# Patient Record
Sex: Female | Born: 1955
Health system: Southern US, Community
[De-identification: ages and names within clinical notes are randomized; demographics above are authoritative.]

## PROBLEM LIST (undated history)

## (undated) HISTORY — PX: TONSILLECTOMY: SUR1361

---

## 2014-12-14 ENCOUNTER — Emergency Department (HOSPITAL_COMMUNITY): Payer: 59

## 2014-12-14 ENCOUNTER — Encounter (HOSPITAL_COMMUNITY): Payer: Self-pay | Admitting: Emergency Medicine

## 2014-12-14 DIAGNOSIS — Z880 Allergy status to penicillin: Secondary | ICD-10-CM | POA: Insufficient documentation

## 2014-12-14 DIAGNOSIS — R42 Dizziness and giddiness: Secondary | ICD-10-CM | POA: Insufficient documentation

## 2014-12-14 DIAGNOSIS — R1013 Epigastric pain: Secondary | ICD-10-CM | POA: Diagnosis not present

## 2014-12-14 DIAGNOSIS — M549 Dorsalgia, unspecified: Secondary | ICD-10-CM | POA: Insufficient documentation

## 2014-12-14 DIAGNOSIS — R110 Nausea: Secondary | ICD-10-CM | POA: Insufficient documentation

## 2014-12-14 DIAGNOSIS — R0602 Shortness of breath: Secondary | ICD-10-CM | POA: Insufficient documentation

## 2014-12-14 DIAGNOSIS — R079 Chest pain, unspecified: Secondary | ICD-10-CM | POA: Insufficient documentation

## 2014-12-14 LAB — BASIC METABOLIC PANEL
ANION GAP: 6 (ref 5–15)
BUN: 11 mg/dL (ref 6–20)
CALCIUM: 9.7 mg/dL (ref 8.9–10.3)
CO2: 30 mmol/L (ref 22–32)
CREATININE: 0.78 mg/dL (ref 0.44–1.00)
Chloride: 105 mmol/L (ref 101–111)
Glucose, Bld: 123 mg/dL — ABNORMAL HIGH (ref 65–99)
Potassium: 3.8 mmol/L (ref 3.5–5.1)
Sodium: 141 mmol/L (ref 135–145)

## 2014-12-14 LAB — I-STAT TROPONIN, ED: TROPONIN I, POC: 0 ng/mL (ref 0.00–0.08)

## 2014-12-14 LAB — CBC
HCT: 43.8 % (ref 36.0–46.0)
HEMOGLOBIN: 14.4 g/dL (ref 12.0–15.0)
MCH: 29.8 pg (ref 26.0–34.0)
MCHC: 32.9 g/dL (ref 30.0–36.0)
MCV: 90.7 fL (ref 78.0–100.0)
PLATELETS: 253 10*3/uL (ref 150–400)
RBC: 4.83 MIL/uL (ref 3.87–5.11)
RDW: 13.7 % (ref 11.5–15.5)
WBC: 9 10*3/uL (ref 4.0–10.5)

## 2014-12-14 NOTE — ED Notes (Signed)
Pt from home for eval of substernal cp that radiates to back, pt reports burning and pain with no relief from taking prilosec and malox. Pt states also has been having intermittent periods of dizziness and near syncope for the past few days. Denies any weakness, no neuro deficits noteed. Skin warm and dry.

## 2014-12-15 ENCOUNTER — Emergency Department (HOSPITAL_COMMUNITY): Payer: 59

## 2014-12-15 ENCOUNTER — Emergency Department (HOSPITAL_COMMUNITY)
Admission: EM | Admit: 2014-12-15 | Discharge: 2014-12-15 | Disposition: A | Discharge status: Home / Self Care | Payer: 59 | Attending: Emergency Medicine | Admitting: Emergency Medicine

## 2014-12-15 DIAGNOSIS — R1013 Epigastric pain: Secondary | ICD-10-CM

## 2014-12-15 DIAGNOSIS — R079 Chest pain, unspecified: Secondary | ICD-10-CM

## 2014-12-15 LAB — HEPATIC FUNCTION PANEL
ALBUMIN: 4 g/dL (ref 3.5–5.0)
ALK PHOS: 75 U/L (ref 38–126)
ALT: 15 U/L (ref 14–54)
AST: 17 U/L (ref 15–41)
Bilirubin, Direct: <0.1 mg/dL — ABNORMAL LOW (ref 0.1–0.5)
TOTAL PROTEIN: 6.5 g/dL (ref 6.5–8.1)
Total Bilirubin: 0.5 mg/dL (ref 0.3–1.2)

## 2014-12-15 LAB — LIPASE, BLOOD: Lipase: 30 U/L (ref 22–51)

## 2014-12-15 LAB — I-STAT TROPONIN, ED: Troponin i, poc: 0 ng/mL (ref 0.00–0.08)

## 2014-12-15 MED ORDER — GI COCKTAIL ~~LOC~~
30.0000 mL | Freq: Once | ORAL | Status: AC
  Administered 2014-12-15: 30 mL via ORAL
  Filled 2014-12-15: qty 30

## 2014-12-15 NOTE — ED Notes (Signed)
Awaiting for new discharge forms for follow up, spoke with Dr. Stark Jock.

## 2014-12-15 NOTE — ED Provider Notes (Signed)
CSN: [id number]     Arrival date & time 12/14/14  2236 History   This chart was scribed for Veryl Speak, MD by Forrestine Him, ED Scribe. This patient was seen in room D33C/D33C and the patient's care was started 12:20 AM.   Chief Complaint  Patient presents with  . Chest Pain  . Dizziness   Patient is a 59 y.o. female presenting with chest pain and dizziness. The history is provided by the patient. No language interpreter was used.  Chest Pain Pain location:  Substernal area Pain quality: burning   Pain radiates to:  Mid back Pain radiates to the back: yes   Pain severity:  Mild Onset quality:  Gradual Duration:  1 day Timing:  Intermittent Progression:  Worsening Chronicity:  New Relieved by:  Nothing Worsened by:  Nothing tried Ineffective treatments:  None tried Associated symptoms: back pain, dizziness, nausea and shortness of breath   Associated symptoms: no abdominal pain, no cough, no fever, no headache and not vomiting   Risk factors: no coronary artery disease, no high cholesterol and no hypertension   Dizziness Associated symptoms: chest pain, nausea and shortness of breath   Associated symptoms: no headaches and no vomiting     HPI Comments: Toni West is a 59 y.o. female without any pertinent past medical history who presents to the Emergency Department complaining of intermittent, ongoing substernal chest pain that radiates to the back onset earlier today.  Pain is described as burning. No aggravating or alleviating factors at this time. She also reports associated shortness of breath and nausea.  [name] mentions ongoing dizziness-near loss of balance in last 2-3 days; worsened today. OTC Prilosec and Maalox attempted prior to arrival without any improvement. No recent fever, chills, or vomiting. No weakness or loss of sensation. No previous history of abdominal surgeries. Pt with known allergies to Metoprolol, Penicillins, and Sulfa antibiotics.  History reviewed. No  pertinent past medical history. Past Surgical History  Procedure Laterality Date  . Tonsillectomy     No family history on file. Social History  Substance Use Topics  . Smoking status: Never Smoker   . Smokeless tobacco: None  . Alcohol Use: Yes     Comment: rarely   OB History    No data available     Review of Systems  Constitutional: Negative for fever and chills.  Respiratory: Positive for shortness of breath. Negative for cough.   Cardiovascular: Positive for chest pain.  Gastrointestinal: Positive for nausea. Negative for vomiting and abdominal pain.  Musculoskeletal: Positive for back pain.  Skin: Negative for rash.  Neurological: Positive for dizziness. Negative for headaches.  Psychiatric/Behavioral: Negative for confusion.  All other systems reviewed and are negative.     Allergies  Metoprolol; Penicillins; and Sulfa antibiotics  Home Medications   Prior to Admission medications   Not on File   Triage Vitals: BP 126/48 mmHg  Pulse 78  Temp(Src) 98 F (36.7 C) (Oral)  Ht 5\' 6"  (1.676 m)  Wt 180 lb (81.647 kg)  BMI 29.07 kg/m2  SpO2 98%   Physical Exam  Constitutional: She is oriented to person, place, and time. She appears well-developed and well-nourished. No distress.  HENT:  Head: Normocephalic and atraumatic.  Eyes: EOM are normal.  Neck: Normal range of motion.  Cardiovascular: Normal rate, regular rhythm and normal heart sounds.   No murmur heard. Pulmonary/Chest: Effort normal and breath sounds normal. No respiratory distress. She has no wheezes. She has no rales.  Abdominal: Soft. She exhibits no distension. There is no tenderness.  Musculoskeletal: Normal range of motion. She exhibits no edema.  Neurological: She is alert and oriented to person, place, and time.  Skin: Skin is warm and dry.  Psychiatric: She has a normal mood and affect. Judgment normal.  Nursing note and vitals reviewed.   ED Course  Procedures (including critical  care time)  DIAGNOSTIC STUDIES: Oxygen Saturation is 98% on RA, Normal by my interpretation.    COORDINATION OF CARE: 12:29 AM- Will order BMP, CBC, CXR, EKG, and i-stat troponin. Will give GI cocktail. Discussed treatment plan with pt at bedside and pt agreed to plan.     Labs Review Labs Reviewed  BASIC METABOLIC PANEL - Abnormal; Notable for the following:    Glucose, Bld 123 (*)    All other components within normal limits  CBC  I-STAT TROPOININ, ED    Imaging Review Dg Chest 2 View  12/14/2014   CLINICAL DATA:  59 year old female with chest pain.  EXAM: CHEST  2 VIEW  COMPARISON:  None.  FINDINGS: The heart size and mediastinal contours are within normal limits. Both lungs are clear. The visualized skeletal structures are unremarkable.  IMPRESSION: No active cardiopulmonary disease.   Electronically Signed   By: Anner Crete M.D.   On: 12/14/2014 23:36   I have personally reviewed and evaluated these images and lab results as part of my medical decision-making.  ED ECG REPORT   Date: 12/15/2014  Rate: 80  Rhythm: normal sinus rhythm  QRS Axis: normal  Intervals: normal  ST/T Wave abnormalities: normal  Conduction Disutrbances:none  Narrative Interpretation:   Old EKG Reviewed: none available  I have personally reviewed the EKG tracing and agree with the computerized printout as noted.   MDM   Final diagnoses:  None    Patient presents with complaints of lower chest/epigastric discomfort that started earlier this evening. She has been observed for many hours in the emergency department, undergoing to individual sets of troponin, repeat EKG, abdominal ultrasound, and multiple laboratory studies. I've been unable to identify a definite cause for her discomfort. Her ultrasound does show gallstones so it is possible this could be biliary colic. Patient was offered pain medication, however has refused. I will advise her to take Zantac twice daily for the next several  days and follow-up with general surgery regarding her gallstones if she does not improve.  I highly doubt a cardiac etiology as the patient has no risk factors, no exertional symptoms, and negative workup. She does understand to return if her symptoms worsen or change.  I personally performed the services described in this documentation, which was scribed in my presence. The recorded information has been reviewed and is accurate.      Veryl Speak, MD 12/15/14 [phone]

## 2014-12-15 NOTE — ED Notes (Signed)
Reported to Dr. Stark Jock gi cocktail was ineffective, md acknowledges, no new orders.

## 2014-12-15 NOTE — ED Notes (Signed)
Called ultrasound for ETA. Patient has transporter in route.

## 2014-12-15 NOTE — Discharge Instructions (Signed)

## 2014-12-15 NOTE — ED Notes (Signed)
Phlebotomy at the bedside  

## 2014-12-15 NOTE — ED Notes (Signed)
Dr. Stark Jock at the bedside.

## 2014-12-23 ENCOUNTER — Encounter (HOSPITAL_COMMUNITY): Payer: Self-pay | Admitting: *Deleted

## 2014-12-23 ENCOUNTER — Emergency Department (HOSPITAL_COMMUNITY)
Admission: EM | Admit: 2014-12-23 | Discharge: 2014-12-23 | Disposition: A | Discharge status: Home / Self Care | Payer: 59 | Source: Home / Self Care | Attending: Emergency Medicine | Admitting: Emergency Medicine

## 2014-12-23 DIAGNOSIS — W57XXXA Bitten or stung by nonvenomous insect and other nonvenomous arthropods, initial encounter: Secondary | ICD-10-CM | POA: Diagnosis not present

## 2014-12-23 DIAGNOSIS — N390 Urinary tract infection, site not specified: Secondary | ICD-10-CM | POA: Diagnosis not present

## 2014-12-23 DIAGNOSIS — S30860A Insect bite (nonvenomous) of lower back and pelvis, initial encounter: Secondary | ICD-10-CM | POA: Diagnosis not present

## 2014-12-23 DIAGNOSIS — R509 Fever, unspecified: Secondary | ICD-10-CM | POA: Diagnosis not present

## 2014-12-23 LAB — POCT URINALYSIS DIP (DEVICE)
Glucose, UA: NEGATIVE mg/dL
Ketones, ur: 15 mg/dL — AB
Nitrite: NEGATIVE
Protein, ur: 100 mg/dL — AB
Specific Gravity, Urine: 1.025 (ref 1.005–1.030)
Urobilinogen, UA: 0.2 mg/dL (ref 0.0–1.0)
pH: 5.5 (ref 5.0–8.0)

## 2014-12-23 MED ORDER — CIPROFLOXACIN HCL 500 MG PO TABS: 500.0000 mg | ORAL_TABLET | Freq: Two times a day (BID) | ORAL | Status: AC

## 2014-12-23 MED ORDER — DOXYCYCLINE HYCLATE 100 MG PO CAPS: 100.0000 mg | ORAL_CAPSULE | Freq: Two times a day (BID) | ORAL | Status: AC

## 2014-12-23 MED ORDER — OXYBUTYNIN CHLORIDE 5 MG PO TABS: 5.0000 mg | ORAL_TABLET | Freq: Three times a day (TID) | ORAL | Status: AC | PRN

## 2014-12-23 NOTE — ED Provider Notes (Signed)
CSN: [id number]     Arrival date & time 12/23/14  1303 History   First MD Initiated Contact with Patient 12/23/14 1312     Chief Complaint  Patient presents with  . Urinary Frequency   (Consider location/radiation/quality/duration/timing/severity/associated sxs/prior Treatment) HPI  She is a 59 year old woman here for evaluation of suprapubic pressure. She states she has at least a six-month history of intermittent suprapubic pressure, but it has gotten much worse in the last 2 days.  She describes a constant pressure sensation in the suprapubic area. She also describes bladder spasms intermittently. She denies any dysuria. She does report some hematuria. She has increased urinary frequency, but also states she has been drinking a lot of water. She denies any history of urinary tract infections. She was diagnosed a year and a half ago with bladder prolapse.  She reports a fever of 101.5 last night. She reports some mild stomach upset. No flank pain. She also states about a week and a half ago she pulled a tick off of her right side. No rashes.  History reviewed. No pertinent past medical history. Past Surgical History  Procedure Laterality Date  . Tonsillectomy     History reviewed. No pertinent family history. Social History  Substance Use Topics  . Smoking status: Never Smoker   . Smokeless tobacco: None  . Alcohol Use: Yes     Comment: rarely   OB History    No data available     Review of Systems As in history of present illness Allergies  Cefzil; Metoprolol; Penicillins; and Sulfa antibiotics  Home Medications   Prior to Admission medications   Medication Sig Start Date End Date Taking? Authorizing Provider  acetaminophen (TYLENOL) 325 MG tablet Take 325 mg by mouth every 6 (six) hours as needed for mild pain.    Historical Provider, MD  ALPRAZolam Duanne Moron) 0.5 MG tablet Take 0.5 mg by mouth 3 (three) times daily. 12/05/14   Historical Provider, MD  alum & mag  hydroxide-simeth (MAALOX/MYLANTA) 200-200-20 MG/5ML suspension Take 30 mLs by mouth every 6 (six) hours as needed for indigestion or heartburn.    Historical Provider, MD  calcium carbonate (TUMS - DOSED IN MG ELEMENTAL CALCIUM) 500 MG chewable tablet Chew 1 tablet by mouth 3 (three) times daily.    Historical Provider, MD  ciprofloxacin (CIPRO) 500 MG tablet Take 1 tablet (500 mg total) by mouth 2 (two) times daily. 12/23/14   Melony Overly, MD  doxycycline (VIBRAMYCIN) 100 MG capsule Take 1 capsule (100 mg total) by mouth 2 (two) times daily. 12/23/14   Melony Overly, MD  esomeprazole (NEXIUM) 20 MG capsule Take 20 mg by mouth once.    Historical Provider, MD  ibuprofen (ADVIL,MOTRIN) 200 MG tablet Take 200 mg by mouth every 6 (six) hours as needed for mild pain.    Historical Provider, MD  oxybutynin (DITROPAN) 5 MG tablet Take 1 tablet (5 mg total) by mouth every 8 (eight) hours as needed for bladder spasms. 12/23/14   Melony Overly, MD   Meds Ordered and Administered this Visit  Medications - No data to display  BP 93/52 mmHg  Pulse 88  Temp(Src) 98.5 F (36.9 C) (Oral)  Resp 16  SpO2 100% No data found.   Physical Exam  Constitutional: She is oriented to person, place, and time. She appears well-developed and well-nourished. No distress.  Cardiovascular: Normal rate.   Pulmonary/Chest: Effort normal.  Abdominal: Soft. Bowel sounds are normal. She exhibits no distension.  There is tenderness (in suprapubic). There is no rebound and no guarding.  No CVA tenderness.  Neurological: She is alert and oriented to person, place, and time.  Skin:  Erythematous papule on right side    ED Course  Procedures (including critical care time)  Labs Review Labs Reviewed  POCT URINALYSIS DIP (DEVICE) - Abnormal; Notable for the following:    Bilirubin Urine SMALL (*)    Ketones, ur 15 (*)    Hgb urine dipstick TRACE (*)    Protein, ur 100 (*)    Leukocytes, UA SMALL (*)    All other components  within normal limits  URINE CULTURE    Imaging Review No results found.    MDM   1. UTI (lower urinary tract infection)   2. Tick bite of back, initial encounter   3. Fever, unspecified fever cause    Urine is concerning for UTI. Urine culture sent. Patient reports intolerance to a number of antibiotics, including syphilis abortions, penicillin, and sulfa. Had an extensive discussion with her regarding my concern that the fever is coming from possible tickborne disease as opposed to urinary tract infection. We'll start with treating the urinary tract infection with Cipro. If her fever does not resolve within 3 days of starting antibiotics, she will start the doxycycline to cover tickborne disease. Prescription for oxybutynin given to help with bladder spasms. Follow-up as needed.    Melony Overly, MD 12/23/14 [phone]

## 2014-12-23 NOTE — ED Notes (Signed)
Pt  Reports  Symptoms  Of urinary    Pressure  In lower  abd       With  Fever   And      Bloating           With onset  X  2-3  Days        Pt     denys  Any      Bleeding    Or  Discharge                 Ambulated  To  Room  With a  stedy  Fluid  Gait

## 2014-12-23 NOTE — Discharge Instructions (Signed)
It looks like you have a urinary tract infection. It is uncommon for a urinary tract infection to cause fever, unless it is in your kidneys. I am concerned that your fever is coming from the tick bite. Please take Cipro twice a day for 7 days. This will treat the urinary tract infection. If you are still having fevers after 3 days of antibiotics, please start the doxycycline to cover tickborne disease. You can use oxybutynin 3 times a day as needed for bladder spasms. Follow-up as needed.

## 2014-12-24 LAB — URINE CULTURE

## 2015-04-23 DIAGNOSIS — K219 Gastro-esophageal reflux disease without esophagitis: Secondary | ICD-10-CM | POA: Diagnosis not present

## 2015-04-25 DIAGNOSIS — F4325 Adjustment disorder with mixed disturbance of emotions and conduct: Secondary | ICD-10-CM | POA: Diagnosis not present

## 2015-04-26 DIAGNOSIS — M25551 Pain in right hip: Secondary | ICD-10-CM | POA: Diagnosis not present

## 2015-04-26 DIAGNOSIS — M25552 Pain in left hip: Secondary | ICD-10-CM | POA: Diagnosis not present

## 2015-04-26 DIAGNOSIS — M1812 Unilateral primary osteoarthritis of first carpometacarpal joint, left hand: Secondary | ICD-10-CM | POA: Diagnosis not present

## 2015-04-26 DIAGNOSIS — M1990 Unspecified osteoarthritis, unspecified site: Secondary | ICD-10-CM | POA: Diagnosis not present

## 2015-04-30 ENCOUNTER — Other Ambulatory Visit: Payer: Self-pay | Admitting: Gastroenterology

## 2015-04-30 DIAGNOSIS — R109 Unspecified abdominal pain: Secondary | ICD-10-CM | POA: Diagnosis not present

## 2015-04-30 DIAGNOSIS — K219 Gastro-esophageal reflux disease without esophagitis: Secondary | ICD-10-CM | POA: Diagnosis not present

## 2015-04-30 DIAGNOSIS — R1310 Dysphagia, unspecified: Secondary | ICD-10-CM

## 2015-04-30 DIAGNOSIS — R079 Chest pain, unspecified: Secondary | ICD-10-CM

## 2015-04-30 DIAGNOSIS — Z1211 Encounter for screening for malignant neoplasm of colon: Secondary | ICD-10-CM | POA: Diagnosis not present

## 2015-05-03 ENCOUNTER — Ambulatory Visit
Admission: RE | Admit: 2015-05-03 | Discharge: 2015-05-03 | Disposition: A | Discharge status: Home / Self Care | Payer: 59 | Source: Ambulatory Visit | Attending: Gastroenterology | Admitting: Gastroenterology

## 2015-05-03 DIAGNOSIS — R079 Chest pain, unspecified: Secondary | ICD-10-CM

## 2015-05-03 DIAGNOSIS — R1310 Dysphagia, unspecified: Secondary | ICD-10-CM

## 2015-05-03 DIAGNOSIS — K219 Gastro-esophageal reflux disease without esophagitis: Secondary | ICD-10-CM

## 2015-05-05 DIAGNOSIS — G43901 Migraine, unspecified, not intractable, with status migrainosus: Secondary | ICD-10-CM | POA: Diagnosis not present

## 2015-05-07 DIAGNOSIS — F4325 Adjustment disorder with mixed disturbance of emotions and conduct: Secondary | ICD-10-CM | POA: Diagnosis not present

## 2015-05-07 MED FILL — ESOMEPRAZOLE MAG DR 40 MG C: 40 | 90 days supply | Qty: 90 | Fill #0

## 2015-05-09 MED FILL — ALPRAZolam 0.5 MG TABS: 0.5 | 30 days supply | Qty: 30 | Fill #0

## 2015-05-26 DIAGNOSIS — G43901 Migraine, unspecified, not intractable, with status migrainosus: Secondary | ICD-10-CM | POA: Diagnosis not present

## 2015-06-04 DIAGNOSIS — F411 Generalized anxiety disorder: Secondary | ICD-10-CM | POA: Diagnosis not present

## 2015-06-13 DIAGNOSIS — K219 Gastro-esophageal reflux disease without esophagitis: Secondary | ICD-10-CM | POA: Diagnosis not present

## 2015-06-20 DIAGNOSIS — F411 Generalized anxiety disorder: Secondary | ICD-10-CM | POA: Diagnosis not present

## 2015-07-03 DIAGNOSIS — G43901 Migraine, unspecified, not intractable, with status migrainosus: Secondary | ICD-10-CM | POA: Diagnosis not present

## 2015-07-05 DIAGNOSIS — L438 Other lichen planus: Secondary | ICD-10-CM | POA: Diagnosis not present

## 2015-07-05 DIAGNOSIS — L570 Actinic keratosis: Secondary | ICD-10-CM | POA: Diagnosis not present

## 2015-08-04 DIAGNOSIS — G43901 Migraine, unspecified, not intractable, with status migrainosus: Secondary | ICD-10-CM | POA: Diagnosis not present

## 2015-08-06 MED FILL — ESOMEPRAZOLE MAG DR 40 MG C: 40 | 90 days supply | Qty: 90 | Fill #1

## 2015-08-28 DIAGNOSIS — R599 Enlarged lymph nodes, unspecified: Secondary | ICD-10-CM | POA: Diagnosis not present

## 2015-08-28 DIAGNOSIS — H6692 Otitis media, unspecified, left ear: Secondary | ICD-10-CM | POA: Diagnosis not present

## 2015-08-28 DIAGNOSIS — J329 Chronic sinusitis, unspecified: Secondary | ICD-10-CM | POA: Diagnosis not present

## 2015-08-28 DIAGNOSIS — F411 Generalized anxiety disorder: Secondary | ICD-10-CM | POA: Diagnosis not present

## 2015-08-28 DIAGNOSIS — H6123 Impacted cerumen, bilateral: Secondary | ICD-10-CM | POA: Diagnosis not present

## 2015-08-28 MED FILL — levoFLOXacin 500 MG TABS: 500 | 10 days supply | Qty: 10 | Fill #0

## 2015-09-12 DIAGNOSIS — L821 Other seborrheic keratosis: Secondary | ICD-10-CM | POA: Diagnosis not present

## 2015-09-12 DIAGNOSIS — D2261 Melanocytic nevi of right upper limb, including shoulder: Secondary | ICD-10-CM | POA: Diagnosis not present

## 2015-09-12 DIAGNOSIS — Z85820 Personal history of malignant melanoma of skin: Secondary | ICD-10-CM | POA: Diagnosis not present

## 2015-09-25 DIAGNOSIS — R221 Localized swelling, mass and lump, neck: Secondary | ICD-10-CM | POA: Diagnosis not present

## 2015-11-07 DIAGNOSIS — F4320 Adjustment disorder, unspecified: Secondary | ICD-10-CM | POA: Diagnosis not present

## 2015-11-08 MED FILL — ESOMEPRAZOLE MAG DR 40 MG C: 40 | 30 days supply | Qty: 60 | Fill #0

## 2015-11-18 MED FILL — ALPRAZolam 0.5 MG TABS: 0.5 | 30 days supply | Qty: 90 | Fill #0

## 2015-11-21 DIAGNOSIS — F4320 Adjustment disorder, unspecified: Secondary | ICD-10-CM | POA: Diagnosis not present

## 2015-12-03 ENCOUNTER — Other Ambulatory Visit (HOSPITAL_COMMUNITY): Payer: Self-pay | Admitting: Internal Medicine

## 2015-12-03 DIAGNOSIS — R29898 Other symptoms and signs involving the musculoskeletal system: Secondary | ICD-10-CM

## 2015-12-03 DIAGNOSIS — M542 Cervicalgia: Secondary | ICD-10-CM

## 2015-12-03 DIAGNOSIS — M50223 Other cervical disc displacement at C6-C7 level: Secondary | ICD-10-CM | POA: Diagnosis not present

## 2015-12-03 DIAGNOSIS — M50222 Other cervical disc displacement at C5-C6 level: Secondary | ICD-10-CM | POA: Diagnosis not present

## 2015-12-03 MED FILL — CYCLOBENZAPRINE 10 MG TAB: 10 | 10 days supply | Qty: 30 | Fill #0

## 2015-12-03 MED FILL — MELOXICAM 7.5 MG TABLET: 7.5 | 15 days supply | Qty: 30 | Fill #0

## 2015-12-05 DIAGNOSIS — F4320 Adjustment disorder, unspecified: Secondary | ICD-10-CM | POA: Diagnosis not present

## 2015-12-06 ENCOUNTER — Ambulatory Visit (HOSPITAL_COMMUNITY)
Admission: RE | Admit: 2015-12-06 | Discharge: 2015-12-06 | Disposition: A | Discharge status: Home / Self Care | Payer: 59 | Source: Ambulatory Visit | Attending: Internal Medicine | Admitting: Internal Medicine

## 2015-12-06 DIAGNOSIS — M542 Cervicalgia: Secondary | ICD-10-CM | POA: Diagnosis not present

## 2015-12-06 DIAGNOSIS — M50322 Other cervical disc degeneration at C5-C6 level: Secondary | ICD-10-CM | POA: Insufficient documentation

## 2015-12-06 DIAGNOSIS — R29898 Other symptoms and signs involving the musculoskeletal system: Secondary | ICD-10-CM | POA: Insufficient documentation

## 2015-12-12 DIAGNOSIS — R29898 Other symptoms and signs involving the musculoskeletal system: Secondary | ICD-10-CM | POA: Diagnosis not present

## 2015-12-12 DIAGNOSIS — M542 Cervicalgia: Secondary | ICD-10-CM | POA: Diagnosis not present

## 2015-12-17 DIAGNOSIS — M542 Cervicalgia: Secondary | ICD-10-CM | POA: Diagnosis not present

## 2015-12-17 DIAGNOSIS — M5091 Cervical disc disorder, unspecified,  high cervical region: Secondary | ICD-10-CM | POA: Diagnosis not present

## 2015-12-17 DIAGNOSIS — M50922 Unspecified cervical disc disorder at C5-C6 level: Secondary | ICD-10-CM | POA: Diagnosis not present

## 2015-12-17 DIAGNOSIS — M50921 Unspecified cervical disc disorder at C4-C5 level: Secondary | ICD-10-CM | POA: Diagnosis not present

## 2015-12-19 DIAGNOSIS — F4320 Adjustment disorder, unspecified: Secondary | ICD-10-CM | POA: Diagnosis not present

## 2015-12-23 MED FILL — predniSONE 10 MG TABS: 10 | 12 days supply | Qty: 21 | Fill #0

## 2015-12-23 MED FILL — traMADol HCL 50 MG TABS: 50 | 7 days supply | Qty: 30 | Fill #0

## 2015-12-23 MED FILL — ALPRAZolam 0.5 MG TABS: 0.5 | 30 days supply | Qty: 90 | Fill #1

## 2015-12-23 MED FILL — ESOMEPRAZOLE MAG DR 40 MG C: 40 | 30 days supply | Qty: 60 | Fill #1

## 2016-01-02 DIAGNOSIS — K219 Gastro-esophageal reflux disease without esophagitis: Secondary | ICD-10-CM | POA: Diagnosis not present

## 2016-01-02 DIAGNOSIS — Z1211 Encounter for screening for malignant neoplasm of colon: Secondary | ICD-10-CM | POA: Diagnosis not present

## 2016-01-02 DIAGNOSIS — R6881 Early satiety: Secondary | ICD-10-CM | POA: Diagnosis not present

## 2016-01-27 DIAGNOSIS — M5030 Other cervical disc degeneration, unspecified cervical region: Secondary | ICD-10-CM | POA: Diagnosis not present

## 2016-01-27 DIAGNOSIS — R5383 Other fatigue: Secondary | ICD-10-CM | POA: Diagnosis not present

## 2016-01-27 DIAGNOSIS — Z0000 Encounter for general adult medical examination without abnormal findings: Secondary | ICD-10-CM | POA: Diagnosis not present

## 2016-01-27 DIAGNOSIS — R7301 Impaired fasting glucose: Secondary | ICD-10-CM | POA: Diagnosis not present

## 2016-01-28 DIAGNOSIS — E559 Vitamin D deficiency, unspecified: Secondary | ICD-10-CM | POA: Diagnosis not present

## 2016-01-28 DIAGNOSIS — M5090 Cervical disc disorder, unspecified, unspecified cervical region: Secondary | ICD-10-CM | POA: Diagnosis not present

## 2016-01-28 DIAGNOSIS — N390 Urinary tract infection, site not specified: Secondary | ICD-10-CM | POA: Diagnosis not present

## 2016-01-28 DIAGNOSIS — Z0000 Encounter for general adult medical examination without abnormal findings: Secondary | ICD-10-CM | POA: Diagnosis not present

## 2016-01-28 DIAGNOSIS — M542 Cervicalgia: Secondary | ICD-10-CM | POA: Diagnosis not present

## 2016-01-28 DIAGNOSIS — R7301 Impaired fasting glucose: Secondary | ICD-10-CM | POA: Diagnosis not present

## 2016-02-06 DIAGNOSIS — Z01419 Encounter for gynecological examination (general) (routine) without abnormal findings: Secondary | ICD-10-CM | POA: Diagnosis not present

## 2016-02-06 DIAGNOSIS — Z6830 Body mass index (BMI) 30.0-30.9, adult: Secondary | ICD-10-CM | POA: Diagnosis not present

## 2016-02-06 DIAGNOSIS — Z1231 Encounter for screening mammogram for malignant neoplasm of breast: Secondary | ICD-10-CM | POA: Diagnosis not present

## 2016-02-12 DIAGNOSIS — F411 Generalized anxiety disorder: Secondary | ICD-10-CM | POA: Diagnosis not present

## 2016-02-13 DIAGNOSIS — F4320 Adjustment disorder, unspecified: Secondary | ICD-10-CM | POA: Diagnosis not present

## 2016-03-05 DIAGNOSIS — M8588 Other specified disorders of bone density and structure, other site: Secondary | ICD-10-CM | POA: Diagnosis not present

## 2016-03-05 DIAGNOSIS — N958 Other specified menopausal and perimenopausal disorders: Secondary | ICD-10-CM | POA: Diagnosis not present

## 2016-03-05 DIAGNOSIS — Z13820 Encounter for screening for osteoporosis: Secondary | ICD-10-CM | POA: Diagnosis not present

## 2016-03-05 DIAGNOSIS — F4320 Adjustment disorder, unspecified: Secondary | ICD-10-CM | POA: Diagnosis not present

## 2016-03-10 MED FILL — ESOMEPRAZOLE MAG DR 40 MG C: 40 | 30 days supply | Qty: 60 | Fill #2

## 2016-03-12 MED FILL — ALPRAZolam 0.5 MG TABS: 0.5 | 50 days supply | Qty: 150 | Fill #0

## 2016-03-27 DIAGNOSIS — F4320 Adjustment disorder, unspecified: Secondary | ICD-10-CM | POA: Diagnosis not present

## 2016-04-14 DIAGNOSIS — F4320 Adjustment disorder, unspecified: Secondary | ICD-10-CM | POA: Diagnosis not present

## 2016-04-30 ENCOUNTER — Other Ambulatory Visit (HOSPITAL_COMMUNITY): Payer: Self-pay | Admitting: Internal Medicine

## 2016-04-30 DIAGNOSIS — I788 Other diseases of capillaries: Secondary | ICD-10-CM | POA: Diagnosis not present

## 2016-04-30 DIAGNOSIS — Z85820 Personal history of malignant melanoma of skin: Secondary | ICD-10-CM | POA: Diagnosis not present

## 2016-04-30 DIAGNOSIS — K625 Hemorrhage of anus and rectum: Secondary | ICD-10-CM | POA: Diagnosis not present

## 2016-04-30 DIAGNOSIS — L570 Actinic keratosis: Secondary | ICD-10-CM | POA: Diagnosis not present

## 2016-04-30 DIAGNOSIS — R1084 Generalized abdominal pain: Secondary | ICD-10-CM

## 2016-04-30 DIAGNOSIS — F4320 Adjustment disorder, unspecified: Secondary | ICD-10-CM | POA: Diagnosis not present

## 2016-04-30 DIAGNOSIS — L821 Other seborrheic keratosis: Secondary | ICD-10-CM | POA: Diagnosis not present

## 2016-04-30 DIAGNOSIS — M5412 Radiculopathy, cervical region: Secondary | ICD-10-CM | POA: Diagnosis not present

## 2016-04-30 DIAGNOSIS — K5900 Constipation, unspecified: Secondary | ICD-10-CM | POA: Diagnosis not present

## 2016-05-01 ENCOUNTER — Encounter (HOSPITAL_COMMUNITY): Payer: Self-pay

## 2016-05-01 ENCOUNTER — Ambulatory Visit (HOSPITAL_COMMUNITY): Payer: 59

## 2016-05-06 ENCOUNTER — Other Ambulatory Visit: Payer: Self-pay | Admitting: Physician Assistant

## 2016-05-06 DIAGNOSIS — K5900 Constipation, unspecified: Secondary | ICD-10-CM | POA: Diagnosis not present

## 2016-05-06 DIAGNOSIS — R1084 Generalized abdominal pain: Secondary | ICD-10-CM

## 2016-05-06 DIAGNOSIS — K219 Gastro-esophageal reflux disease without esophagitis: Secondary | ICD-10-CM | POA: Diagnosis not present

## 2016-05-06 DIAGNOSIS — K648 Other hemorrhoids: Secondary | ICD-10-CM | POA: Diagnosis not present

## 2016-05-06 DIAGNOSIS — K625 Hemorrhage of anus and rectum: Secondary | ICD-10-CM | POA: Diagnosis not present

## 2016-05-06 DIAGNOSIS — Z800 Family history of malignant neoplasm of digestive organs: Secondary | ICD-10-CM

## 2016-05-06 MED FILL — PANTOPRAZOLE SOD DR 40 MG T: 40 | 30 days supply | Qty: 30 | Fill #0

## 2016-05-11 MED FILL — ALPRAZolam 0.5 MG TABS: 0.5 | 50 days supply | Qty: 150 | Fill #1

## 2016-05-13 DIAGNOSIS — F4320 Adjustment disorder, unspecified: Secondary | ICD-10-CM | POA: Diagnosis not present

## 2016-05-14 ENCOUNTER — Ambulatory Visit
Admission: RE | Admit: 2016-05-14 | Discharge: 2016-05-14 | Disposition: A | Discharge status: Home / Self Care | Payer: 59 | Source: Ambulatory Visit | Attending: Physician Assistant | Admitting: Physician Assistant

## 2016-05-14 DIAGNOSIS — K625 Hemorrhage of anus and rectum: Secondary | ICD-10-CM

## 2016-05-14 DIAGNOSIS — Z800 Family history of malignant neoplasm of digestive organs: Secondary | ICD-10-CM

## 2016-05-14 DIAGNOSIS — R1084 Generalized abdominal pain: Secondary | ICD-10-CM

## 2016-05-14 DIAGNOSIS — K8020 Calculus of gallbladder without cholecystitis without obstruction: Secondary | ICD-10-CM | POA: Diagnosis not present

## 2016-05-14 MED ORDER — IOPAMIDOL (ISOVUE-300) INJECTION 61%
100.0000 mL | Freq: Once | INTRAVENOUS | Status: AC | PRN
  Administered 2016-05-14: 100 mL via INTRAVENOUS

## 2016-05-27 DIAGNOSIS — K625 Hemorrhage of anus and rectum: Secondary | ICD-10-CM | POA: Diagnosis not present

## 2016-05-27 DIAGNOSIS — R1013 Epigastric pain: Secondary | ICD-10-CM | POA: Diagnosis not present

## 2016-05-27 DIAGNOSIS — K5901 Slow transit constipation: Secondary | ICD-10-CM | POA: Diagnosis not present

## 2016-05-27 DIAGNOSIS — K219 Gastro-esophageal reflux disease without esophagitis: Secondary | ICD-10-CM | POA: Diagnosis not present

## 2016-05-27 DIAGNOSIS — K8020 Calculus of gallbladder without cholecystitis without obstruction: Secondary | ICD-10-CM | POA: Diagnosis not present

## 2016-05-27 MED FILL — raNITIdine HCL 150 MG TABS: 150 | 30 days supply | Qty: 30 | Fill #0

## 2016-06-10 DIAGNOSIS — F4320 Adjustment disorder, unspecified: Secondary | ICD-10-CM | POA: Diagnosis not present

## 2016-06-11 MED FILL — PANTOPRAZOLE SOD DR 40 MG T: 40 | 30 days supply | Qty: 30 | Fill #0

## 2016-07-06 MED FILL — PANTOPRAZOLE SOD DR 40 MG T: 40 | 90 days supply | Qty: 90 | Fill #1

## 2016-07-29 DIAGNOSIS — F411 Generalized anxiety disorder: Secondary | ICD-10-CM | POA: Diagnosis not present

## 2016-07-29 DIAGNOSIS — F4320 Adjustment disorder, unspecified: Secondary | ICD-10-CM | POA: Diagnosis not present

## 2016-08-04 MED FILL — raNITIdine HCL 150 MG TABS: 150 | 30 days supply | Qty: 30 | Fill #1

## 2016-08-18 DIAGNOSIS — H524 Presbyopia: Secondary | ICD-10-CM | POA: Diagnosis not present

## 2016-08-18 MED FILL — ALPRAZolam 0.5 MG TABS: 0.5 | 50 days supply | Qty: 150 | Fill #0

## 2016-08-18 MED FILL — traZODone HCL 50 MG TABS: 50 | 30 days supply | Qty: 60 | Fill #0

## 2016-08-27 DIAGNOSIS — R42 Dizziness and giddiness: Secondary | ICD-10-CM | POA: Diagnosis not present

## 2016-08-27 DIAGNOSIS — R0683 Snoring: Secondary | ICD-10-CM | POA: Diagnosis not present

## 2016-08-27 DIAGNOSIS — S6992XA Unspecified injury of left wrist, hand and finger(s), initial encounter: Secondary | ICD-10-CM | POA: Diagnosis not present

## 2016-08-27 DIAGNOSIS — R0681 Apnea, not elsewhere classified: Secondary | ICD-10-CM | POA: Diagnosis not present

## 2016-08-28 ENCOUNTER — Other Ambulatory Visit: Payer: Self-pay | Admitting: Registered Nurse

## 2016-08-28 ENCOUNTER — Ambulatory Visit
Admission: RE | Admit: 2016-08-28 | Discharge: 2016-08-28 | Disposition: A | Discharge status: Home / Self Care | Payer: 59 | Source: Ambulatory Visit | Attending: Registered Nurse | Admitting: Registered Nurse

## 2016-08-28 DIAGNOSIS — M79642 Pain in left hand: Secondary | ICD-10-CM

## 2016-08-28 DIAGNOSIS — S6992XA Unspecified injury of left wrist, hand and finger(s), initial encounter: Secondary | ICD-10-CM | POA: Diagnosis not present

## 2016-09-08 IMAGING — RF DG ESOPHAGUS
8 of 10 series · 19 of 24 positions shown · non-contrast
Comparison: None.

CLINICAL DATA: 59-year-old female with dysphagia and chest pain for
3 weeks.

EXAM:
ESOPHOGRAM / BARIUM SWALLOW / BARIUM TABLET STUDY
TECHNIQUE: Combined double contrast and single contrast examination performed
using effervescent crystals, thick barium liquid, and thin barium
liquid. The patient was observed with fluoroscopy swallowing a 13 mm
barium sulphate tablet.
FLUOROSCOPY TIME:  Fluoroscopy Time:  1 minutes 36 seconds
Number of Acquired Images:  Multiple

[Series 1: run · 6 of 12 slices shown (1 of 8)]
[im 1/12]
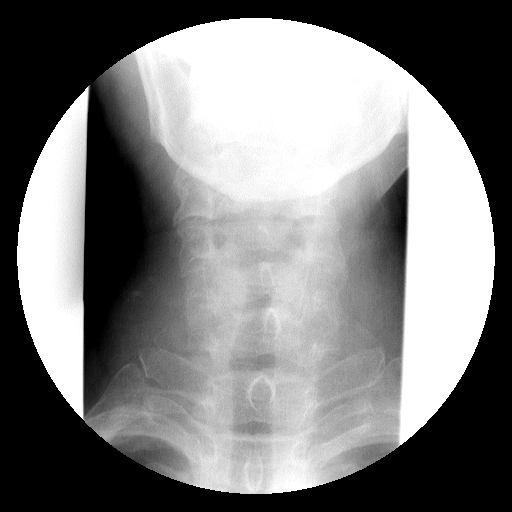
[im 2/12]
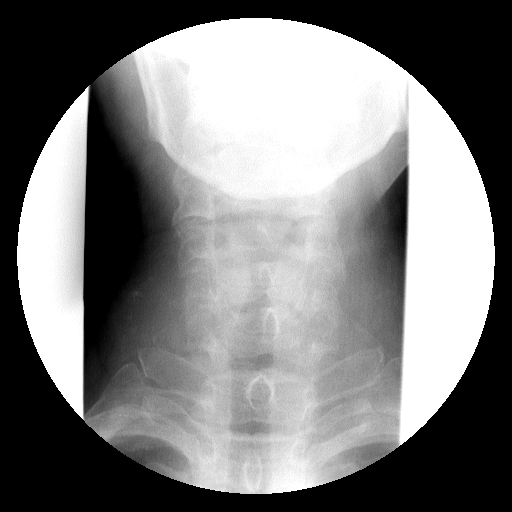
[im 5/12]
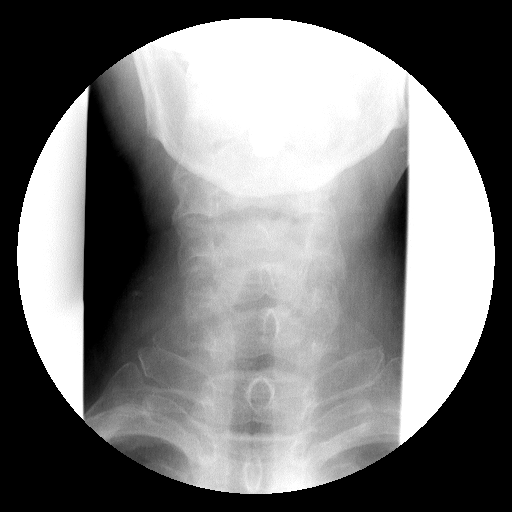
[im 7/12]
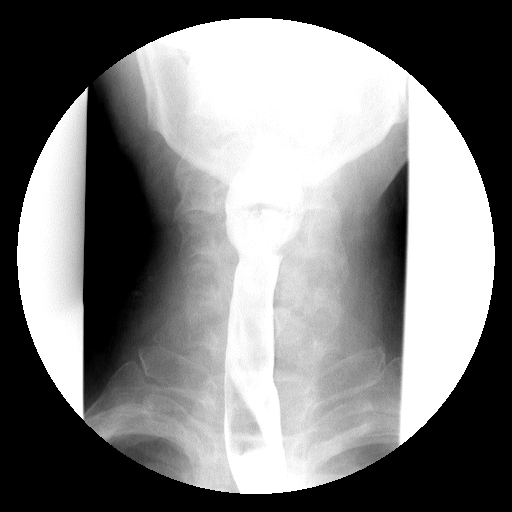
[im 8/12]
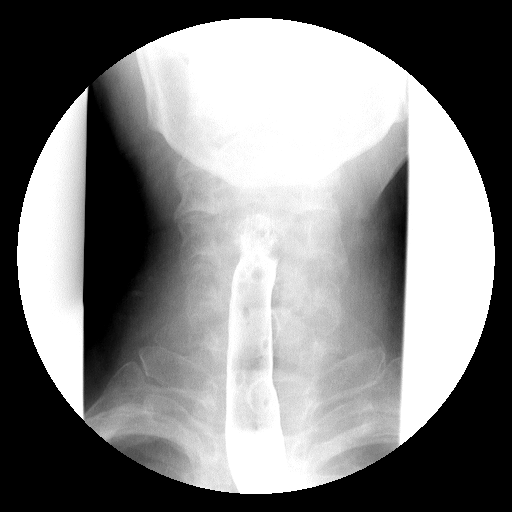
[im 10/12]
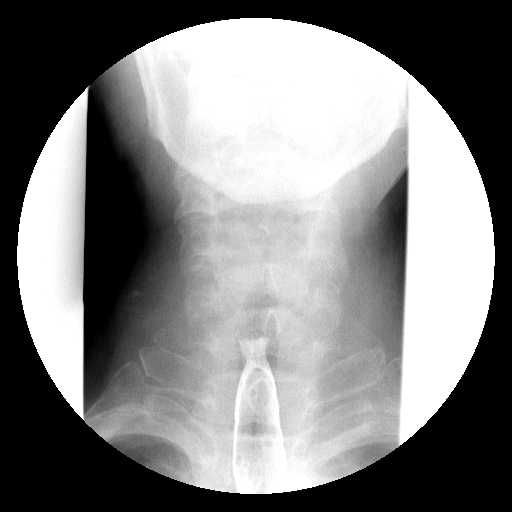

[Series 2: run · 7 of 13 slices shown (2 of 8)]
[im 1/13]
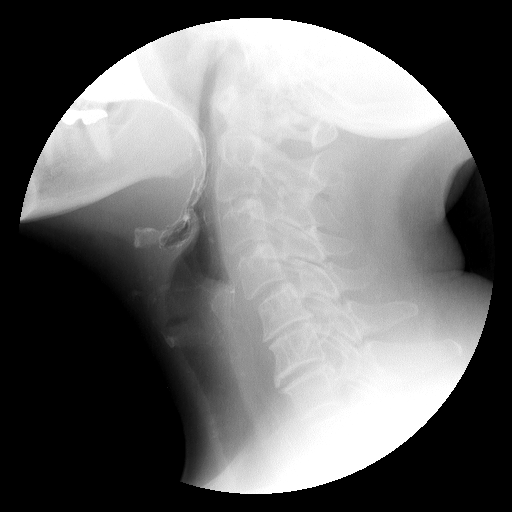
[im 2/13]
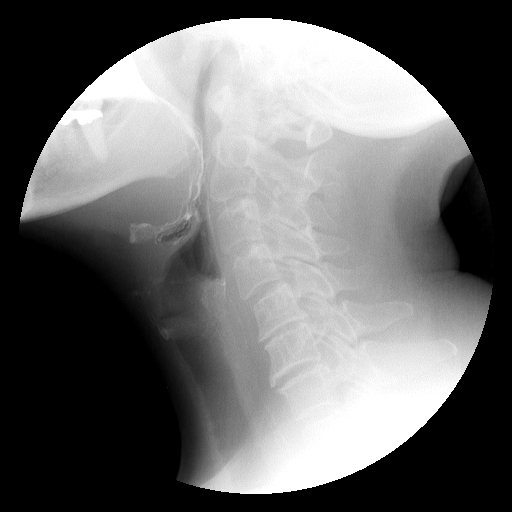
[im 4/13]
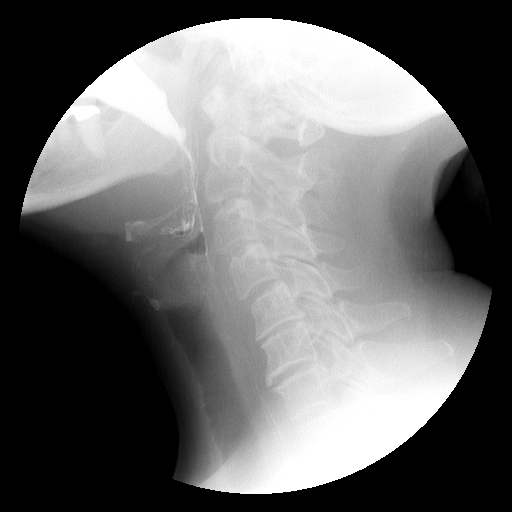
[im 7/13]
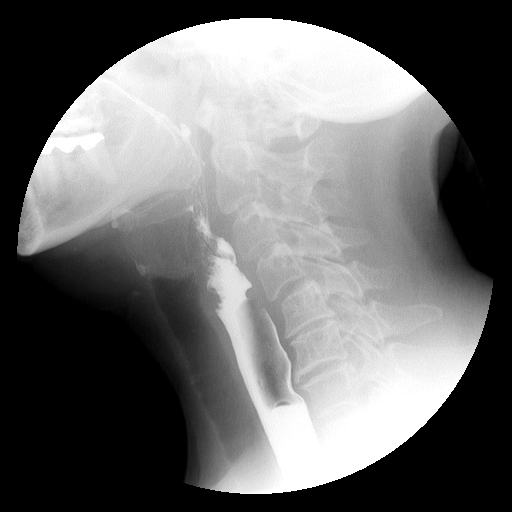
[im 9/13]
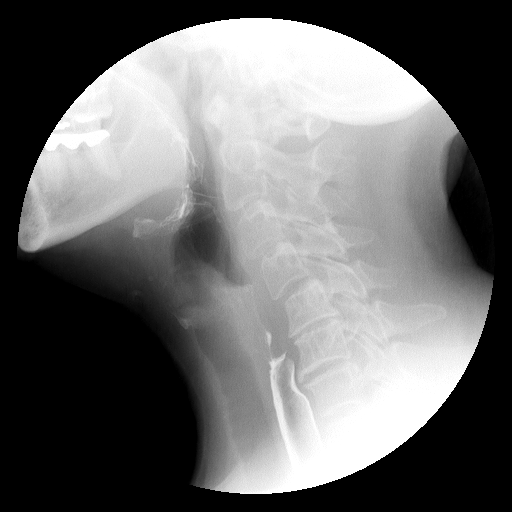
[im 11/13]
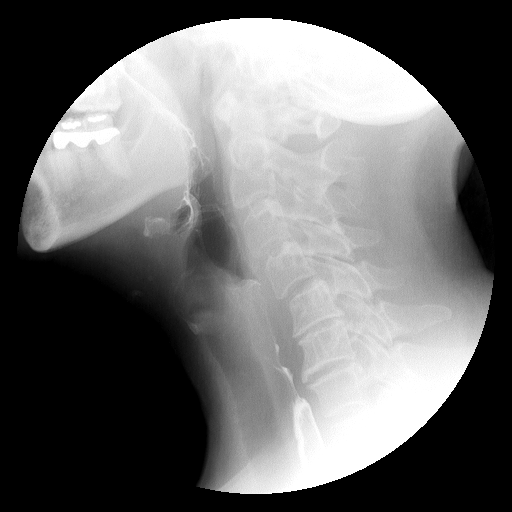
[im 13/13]
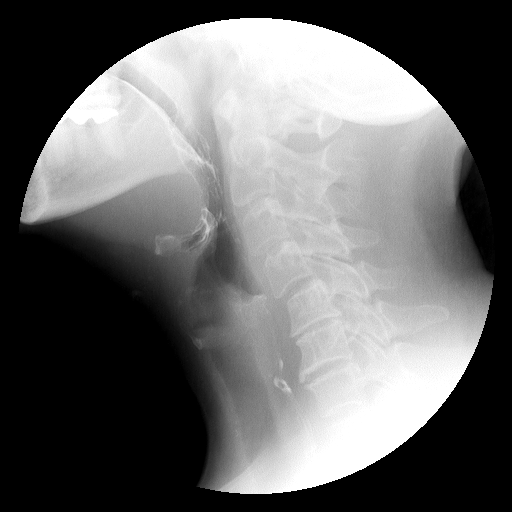

[Series 4: run · 1 of 1 slices shown (3 of 8)]
[im 1/1]
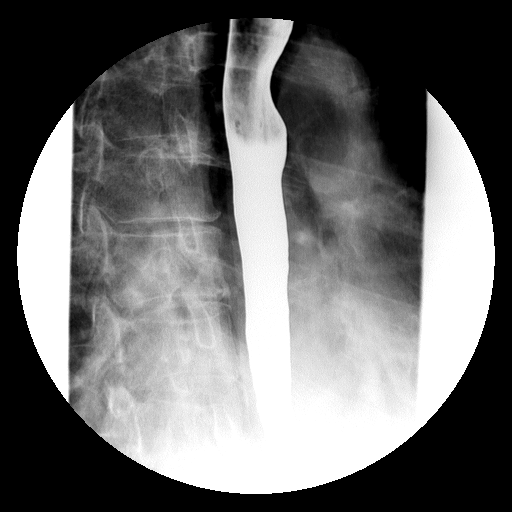

[Series 5: run · 1 of 1 slices shown (4 of 8)]
[im 1/1]
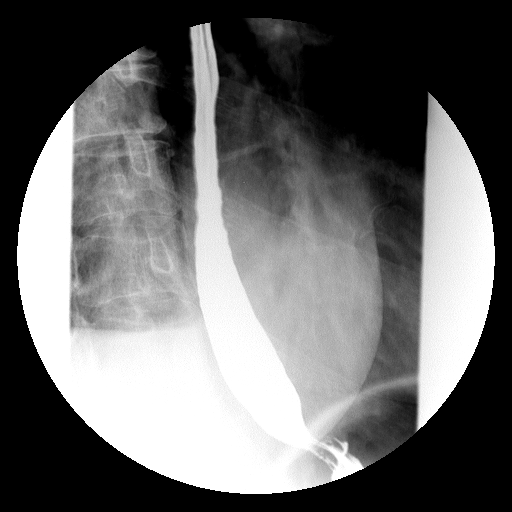

[Series 6: run · 1 of 1 slices shown (5 of 8)]
[im 1/1]
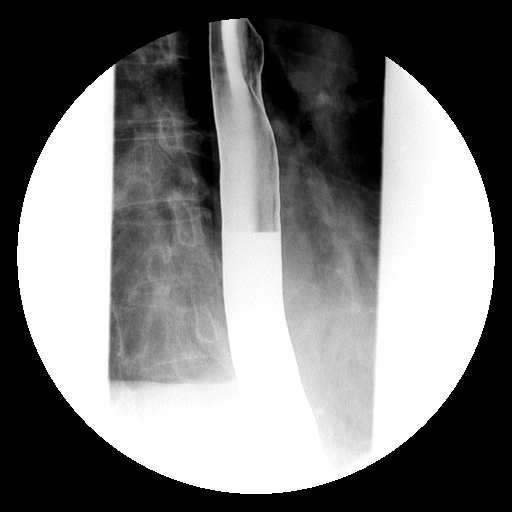

[Series 7: run · 1 of 1 slices shown (6 of 8)]
[im 1/1]
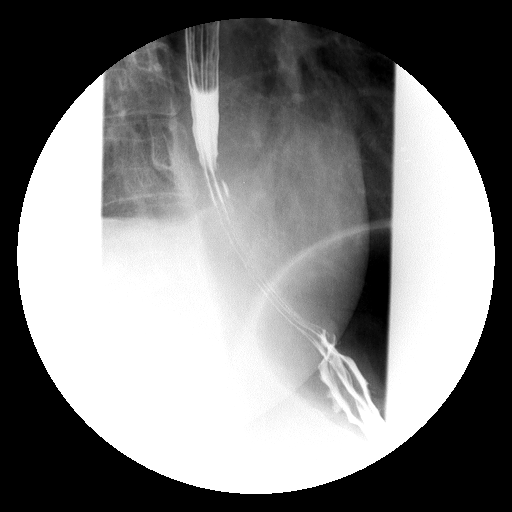

[Series 9: run · 1 of 1 slices shown (7 of 8)]
[im 1/1]
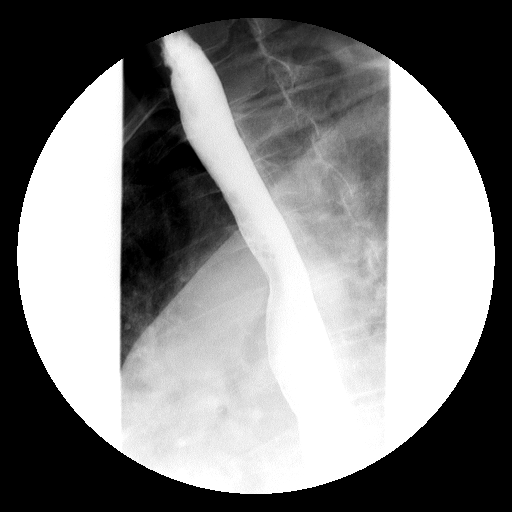

[Series 10: run · 1 of 1 slices shown (8 of 8)]
[im 1/1]
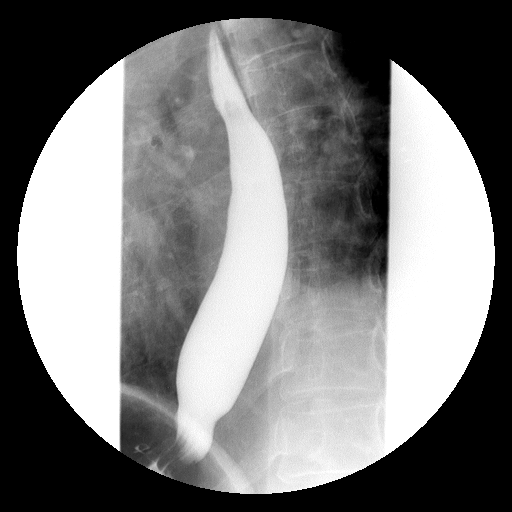

[19 of 24 positions shown; findings below may reference images not displayed]

FINDINGS: The pharynx is unremarkable.

Normal primary esophageal contractions are identified.

There is no evidence of fixed filling defect, mucosal abnormality or
areas of fixed narrowing.

The barium tablet passed through the esophagus and into the stomach
without obstruction.

There were no episodes of gastroesophageal reflux noted during the
examination.
IMPRESSION: Unremarkable exam.

## 2016-09-10 DIAGNOSIS — F3289 Other specified depressive episodes: Secondary | ICD-10-CM | POA: Diagnosis not present

## 2016-09-30 MED FILL — raNITIdine HCL 150 MG TABS: 150 | 30 days supply | Qty: 30 | Fill #2

## 2016-09-30 MED FILL — PANTOPRAZOLE SOD DR 40 MG T: 40 | 30 days supply | Qty: 30 | Fill #2

## 2016-10-20 MED FILL — ALPRAZolam 0.5 MG TABS: 0.5 | 50 days supply | Qty: 150 | Fill #1

## 2017-01-06 MED FILL — ALPRAZolam 0.5 MG TABS: 0.5 | 50 days supply | Qty: 150 | Fill #2

## 2017-01-06 MED FILL — raNITIdine HCL 150 MG TABS: 150 | 30 days supply | Qty: 30 | Fill #3

## 2017-01-06 MED FILL — PROGESTERONE 200 MG CAPSULE: 200 | 30 days supply | Qty: 30 | Fill #0

## 2017-03-01 MED FILL — raNITIdine HCL 150 MG TABS: 150 | 30 days supply | Qty: 30 | Fill #4

## 2017-03-01 MED FILL — PANTOPRAZOLE SOD DR 40 MG T: 40 | 30 days supply | Qty: 30 | Fill #3

## 2017-04-09 MED FILL — PANTOPRAZOLE SOD DR 40 MG T: 40 | 90 days supply | Qty: 90 | Fill #4

## 2017-04-09 MED FILL — traZODone HCL 50 MG TABS: 50 | 30 days supply | Qty: 60 | Fill #0

## 2017-04-09 MED FILL — ALPRAZolam 0.5 MG TABS: 0.5 | 50 days supply | Qty: 150 | Fill #0

## 2017-04-23 ENCOUNTER — Other Ambulatory Visit: Payer: Self-pay | Admitting: Obstetrics & Gynecology

## 2017-04-23 DIAGNOSIS — R928 Other abnormal and inconclusive findings on diagnostic imaging of breast: Secondary | ICD-10-CM

## 2017-04-26 MED FILL — FLUOCINONIDE 0.05% CREAM: 0.05 | 15 days supply | Qty: 30 | Fill #0

## 2017-04-29 ENCOUNTER — Other Ambulatory Visit: Payer: Self-pay | Admitting: Obstetrics & Gynecology

## 2017-04-29 ENCOUNTER — Ambulatory Visit
Admission: RE | Admit: 2017-04-29 | Discharge: 2017-04-29 | Disposition: A | Discharge status: Home / Self Care | Payer: 59 | Source: Ambulatory Visit | Attending: Obstetrics & Gynecology | Admitting: Obstetrics & Gynecology

## 2017-04-29 DIAGNOSIS — R921 Mammographic calcification found on diagnostic imaging of breast: Secondary | ICD-10-CM

## 2017-04-29 DIAGNOSIS — R928 Other abnormal and inconclusive findings on diagnostic imaging of breast: Secondary | ICD-10-CM

## 2017-05-03 ENCOUNTER — Other Ambulatory Visit: Payer: 59

## 2017-06-28 MED FILL — raNITIdine HCL 150 MG TABS: 150 | 30 days supply | Qty: 30 | Fill #0

## 2017-06-28 MED FILL — ALPRAZolam 0.5 MG TABS: 0.5 | 30 days supply | Qty: 90 | Fill #1

## 2017-08-06 MED FILL — raNITIdine HCL 150 MG TABS: 150 | 30 days supply | Qty: 30 | Fill #1
